# Patient Record
Sex: Male | Born: 2000 | Race: Asian | Hispanic: No | Marital: Single | Smoking: Never smoker
Health system: Southern US, Community
[De-identification: ages and names within clinical notes are randomized; demographics above are authoritative.]

---

## 2020-09-16 ENCOUNTER — Emergency Department (HOSPITAL_COMMUNITY): Payer: Medicaid Other

## 2020-09-16 ENCOUNTER — Other Ambulatory Visit: Payer: Self-pay

## 2020-09-16 ENCOUNTER — Emergency Department (HOSPITAL_COMMUNITY)
Admission: EM | Admit: 2020-09-16 | Discharge: 2020-09-17 | Disposition: A | Discharge status: Home / Self Care | Payer: Medicaid Other | Attending: Emergency Medicine | Admitting: Emergency Medicine

## 2020-09-16 ENCOUNTER — Encounter (HOSPITAL_COMMUNITY): Payer: Self-pay | Admitting: Emergency Medicine

## 2020-09-16 DIAGNOSIS — R0789 Other chest pain: Secondary | ICD-10-CM | POA: Insufficient documentation

## 2020-09-16 DIAGNOSIS — S42034A Nondisplaced fracture of lateral end of right clavicle, initial encounter for closed fracture: Secondary | ICD-10-CM | POA: Diagnosis not present

## 2020-09-16 DIAGNOSIS — Y92410 Unspecified street and highway as the place of occurrence of the external cause: Secondary | ICD-10-CM | POA: Diagnosis not present

## 2020-09-16 DIAGNOSIS — S4991XA Unspecified injury of right shoulder and upper arm, initial encounter: Secondary | ICD-10-CM | POA: Diagnosis present

## 2020-09-16 DIAGNOSIS — S82001A Unspecified fracture of right patella, initial encounter for closed fracture: Secondary | ICD-10-CM | POA: Insufficient documentation

## 2020-09-16 DIAGNOSIS — Z23 Encounter for immunization: Secondary | ICD-10-CM | POA: Insufficient documentation

## 2020-09-16 LAB — CBC WITH DIFFERENTIAL/PLATELET
Abs Immature Granulocytes: 0.07 10*3/uL (ref 0.00–0.07)
Basophils Absolute: 0.1 10*3/uL (ref 0.0–0.1)
Basophils Relative: 0 %
Eosinophils Absolute: 0 10*3/uL (ref 0.0–0.5)
Eosinophils Relative: 0 %
HCT: 43.7 % (ref 39.0–52.0)
Hemoglobin: 15 g/dL (ref 13.0–17.0)
Immature Granulocytes: 1 %
Lymphocytes Relative: 5 %
Lymphs Abs: 0.7 10*3/uL (ref 0.7–4.0)
MCH: 29.9 pg (ref 26.0–34.0)
MCHC: 34.3 g/dL (ref 30.0–36.0)
MCV: 87.1 fL (ref 80.0–100.0)
Monocytes Absolute: 1.2 10*3/uL — ABNORMAL HIGH (ref 0.1–1.0)
Monocytes Relative: 8 %
Neutro Abs: 13.1 10*3/uL — ABNORMAL HIGH (ref 1.7–7.7)
Neutrophils Relative %: 86 %
Platelets: 234 10*3/uL (ref 150–400)
RBC: 5.02 MIL/uL (ref 4.22–5.81)
RDW: 12 % (ref 11.5–15.5)
WBC: 15.2 10*3/uL — ABNORMAL HIGH (ref 4.0–10.5)
nRBC: 0 % (ref 0.0–0.2)

## 2020-09-16 LAB — COMPREHENSIVE METABOLIC PANEL
ALT: 111 U/L — ABNORMAL HIGH (ref 0–44)
AST: 147 U/L — ABNORMAL HIGH (ref 15–41)
Albumin: 4.1 g/dL (ref 3.5–5.0)
Alkaline Phosphatase: 46 U/L (ref 38–126)
Anion gap: 8 (ref 5–15)
BUN: 12 mg/dL (ref 6–20)
CO2: 28 mmol/L (ref 22–32)
Calcium: 9.1 mg/dL (ref 8.9–10.3)
Chloride: 104 mmol/L (ref 98–111)
Creatinine, Ser: 0.95 mg/dL (ref 0.61–1.24)
GFR, Estimated: >60 mL/min (ref >60)
Glucose, Bld: 130 mg/dL — ABNORMAL HIGH (ref 70–99)
Potassium: 3.5 mmol/L (ref 3.5–5.1)
Sodium: 140 mmol/L (ref 135–145)
Total Bilirubin: 0.6 mg/dL (ref 0.3–1.2)
Total Protein: 7.2 g/dL (ref 6.5–8.1)

## 2020-09-16 MED ORDER — TETANUS-DIPHTH-ACELL PERTUSSIS 5-2.5-18.5 LF-MCG/0.5 IM SUSY
0.5000 mL | PREFILLED_SYRINGE | Freq: Once | INTRAMUSCULAR | Status: AC
  Administered 2020-09-17: 0.5 mL via INTRAMUSCULAR
  Filled 2020-09-16: qty 0.5
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
## 2020-09-17 MED ORDER — OXYCODONE-ACETAMINOPHEN 5-325 MG PO TABS
1.0000 | ORAL_TABLET | Freq: Once | ORAL | Status: AC
  Administered 2020-09-17: 1 via ORAL
  Filled 2020-09-17: qty 1

## 2020-09-17 MED ORDER — OXYCODONE HCL 5 MG PO TABS: 5.0000 mg | ORAL_TABLET | ORAL | 0 refills | Status: AC | PRN

## 2020-09-17 MED ORDER — WHEELCHAIR MISC: 0 refills | Status: AC
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
## 2020-09-17 NOTE — Progress Notes (Signed)
Orthopedic Tech Progress Note Patient Details:  Stephen Kent 01-06-2001 [id number] Applied arm sling and knee immobilizer. Ortho Devices Type of Ortho Device: Knee Immobilizer,Sling immobilizer Ortho Device/Splint Location: RUE, RLE Ortho Device/Splint Interventions: Ordered,Application   Post Interventions Patient Tolerated: Well Instructions Provided: Adjustment of device   Kerry [place] 09/17/2020, 3:42 AM

## 2020-09-23 ENCOUNTER — Encounter: Payer: Self-pay | Admitting: Physician Assistant

## 2020-09-23 ENCOUNTER — Ambulatory Visit (INDEPENDENT_AMBULATORY_CARE_PROVIDER_SITE_OTHER): Payer: Medicaid Other | Admitting: Physician Assistant

## 2020-09-23 ENCOUNTER — Ambulatory Visit: Payer: Self-pay

## 2020-09-23 DIAGNOSIS — S82001A Unspecified fracture of right patella, initial encounter for closed fracture: Secondary | ICD-10-CM | POA: Diagnosis not present

## 2020-09-23 DIAGNOSIS — S42001A Fracture of unspecified part of right clavicle, initial encounter for closed fracture: Secondary | ICD-10-CM

## 2020-09-23 DIAGNOSIS — M25561 Pain in right knee: Secondary | ICD-10-CM
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
## 2020-10-09 ENCOUNTER — Encounter: Payer: Self-pay | Admitting: Orthopaedic Surgery

## 2020-10-09 ENCOUNTER — Ambulatory Visit (INDEPENDENT_AMBULATORY_CARE_PROVIDER_SITE_OTHER): Payer: Medicaid Other

## 2020-10-09 ENCOUNTER — Ambulatory Visit (INDEPENDENT_AMBULATORY_CARE_PROVIDER_SITE_OTHER): Payer: Medicaid Other | Admitting: Orthopaedic Surgery

## 2020-10-09 DIAGNOSIS — S82001A Unspecified fracture of right patella, initial encounter for closed fracture: Secondary | ICD-10-CM

## 2020-10-09 DIAGNOSIS — M25511 Pain in right shoulder: Secondary | ICD-10-CM

## 2020-10-09 DIAGNOSIS — S82001D Unspecified fracture of right patella, subsequent encounter for closed fracture with routine healing: Secondary | ICD-10-CM | POA: Diagnosis not present
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
## 2021-04-03 ENCOUNTER — Encounter (HOSPITAL_COMMUNITY): Payer: Self-pay | Admitting: Emergency Medicine

## 2021-04-03 ENCOUNTER — Ambulatory Visit (HOSPITAL_COMMUNITY)
Admission: EM | Admit: 2021-04-03 | Discharge: 2021-04-03 | Disposition: A | Discharge status: Home / Self Care | Payer: Medicaid Other | Attending: Emergency Medicine | Admitting: Emergency Medicine

## 2021-04-03 ENCOUNTER — Other Ambulatory Visit: Payer: Self-pay

## 2021-04-03 DIAGNOSIS — F1290 Cannabis use, unspecified, uncomplicated: Secondary | ICD-10-CM

## 2021-04-03 DIAGNOSIS — R1084 Generalized abdominal pain: Secondary | ICD-10-CM

## 2021-04-03 LAB — POCT URINALYSIS DIPSTICK, ED / UC
Bilirubin Urine: NEGATIVE
Glucose, UA: NEGATIVE mg/dL
Hgb urine dipstick: NEGATIVE
Ketones, ur: NEGATIVE mg/dL
Leukocytes,Ua: NEGATIVE
Nitrite: NEGATIVE
Protein, ur: NEGATIVE mg/dL
Specific Gravity, Urine: 1.02 (ref 1.005–1.030)
Urobilinogen, UA: 0.2 mg/dL (ref 0.0–1.0)
pH: 6.5 (ref 5.0–8.0)

## 2021-04-03 NOTE — ED Provider Notes (Signed)
[facility]    CSN: [id number] Arrival date & time: 04/03/21  1744      History   Chief Complaint Chief Complaint  Patient presents with   Abdominal Pain    HPI Stephen Kent is a 20 y.o. male.   Patient here for evaluation of abnormal feeling in his abdomen.  Reports that it "feels like warm wet liquid inside of his abdomen."  He also reports that it "feels like things are moving around in there."  Reports symptoms have been ongoing for the past 2 days.  Denies any specific abdominal pain, nausea, or vomiting.  Denies any dysuria, urgency, or frequency.  Reports that he recently started taking marijuana edibles to increase his appetite to help him gain weight.  Reports that in the last week or 2 he increased his dose.  Denies any trauma, injury, or other precipitating event.  Denies any specific alleviating or aggravating factors.  Denies any fevers, chest pain, shortness of breath, numbness, tingling, weakness, or headaches.     The history is provided by the patient.  Abdominal Pain Associated symptoms: no diarrhea, no nausea and no vomiting    History reviewed. No pertinent past medical history.  There are no problems to display for this patient.   History reviewed. No pertinent surgical history.     Home Medications    Prior to Admission medications   Medication Sig Start Date End Date Taking? Authorizing Provider  Misc. Devices [facility]) MISC 1 chair for home use 09/17/20   Garlon Hatchet, PA-C  oxyCODONE (OXY IR/ROXICODONE) 5 MG immediate release tablet Take 1 tablet (5 mg total) by mouth every 4 (four) hours as needed for severe pain. 09/17/20   Garlon Hatchet, PA-C    Family History Family History  Problem Relation Age of Onset   Kidney Stones Mother     Social History Social History   Tobacco Use   Smoking status: Never   Smokeless tobacco: Never  Vaping Use   Vaping Use: Never used  Substance Use Topics   Alcohol use: Yes   Drug use:  Never    Comment: edible     Allergies   Patient has no known allergies.   Review of Systems Review of Systems  Gastrointestinal:  Positive for abdominal pain. Negative for diarrhea, nausea and vomiting.  All other systems reviewed and are negative.   Physical Exam Triage Vital Signs ED Triage Vitals  Enc Vitals Group     BP 04/03/21 1859 105/86     Pulse Rate 04/03/21 1859 97     Resp 04/03/21 1859 18     Temp 04/03/21 1859 98.9 F (37.2 C)     Temp Source 04/03/21 1859 Oral     SpO2 04/03/21 1859 98 %     Weight --      Height --      Head Circumference --      Peak Flow --      Pain Score 04/03/21 1855 0     Pain Loc --      Pain Edu? --      Excl. in GC? --    No data found.  Updated Vital Signs BP 105/86 (BP Location: Left Arm)   Pulse 97   Temp 98.9 F (37.2 C) (Oral)   Resp 18   SpO2 98%   Visual Acuity Right Eye Distance:   Left Eye Distance:   Bilateral Distance:    Right Eye Near:  Left Eye Near:    Bilateral Near:     Physical Exam Vitals and nursing note reviewed.  Constitutional:      General: He is not in acute distress.    Appearance: Normal appearance. He is not ill-appearing, toxic-appearing or diaphoretic.  HENT:     Head: Normocephalic and atraumatic.  Eyes:     Conjunctiva/sclera: Conjunctivae normal.  Cardiovascular:     Rate and Rhythm: Normal rate.     Pulses: Normal pulses.  Pulmonary:     Effort: Pulmonary effort is normal.  Abdominal:     General: Abdomen is flat.     Palpations: Abdomen is soft.     Tenderness: There is no abdominal tenderness. There is no right CVA tenderness or left CVA tenderness.     Hernia: No hernia is present.  Musculoskeletal:        General: Normal range of motion.     Cervical back: Normal range of motion.  Skin:    General: Skin is warm and dry.  Neurological:     General: No focal deficit present.     Mental Status: He is alert and oriented to person, place, and time.   Psychiatric:        Mood and Affect: Mood normal.     UC Treatments / Results  Labs (all labs ordered are listed, but only abnormal results are displayed) Labs Reviewed  POCT URINALYSIS DIPSTICK, ED / UC    EKG   Radiology No results found.  Procedures Procedures (including critical care time)  Medications Ordered in UC Medications - No data to display  Initial Impression / Assessment and Plan / UC Course  I have reviewed the triage vital signs and the nursing notes.  Pertinent labs & imaging results that were available during my care of the patient were reviewed by me and considered in my medical decision making (see chart for details).    Assessment negative for red flags or concerns.  Urinalysis with no signs of infection.  This is likely related to marijuana use.  Recommend stop using edibles.  Drink plenty of fluids and rest.  Follow-up as needed Final Clinical Impressions(s) / UC Diagnoses   Final diagnoses:  Generalized abdominal pain  Marijuana use     Discharge Instructions      Stop using the edibles. Make sure you are drinking plenty of water.  Return or go to the Emergency Department if symptoms worsen or do not improve in the next few days.      ED Prescriptions   None    PDMP not reviewed this encounter.   Ivette Loyal, NP 04/03/21 1942

## 2021-04-03 NOTE — ED Triage Notes (Addendum)
Reports abdominal pain, patient denies burning with urination.patient reports feeling "warm water..basically like piss, warm and wet" wrapping around body.  Patient has had this sensation for 2-3 days.  Last bowel movement was today and normal .  Patient does not feel the need to urinate and this concerns him.  Patient does not report a decrease in urine output or any pain with urination.  Patient recently started eating edibles "to make him eat"

## 2021-04-03 NOTE — Discharge Instructions (Signed)
Stop using the edibles. Make sure you are drinking plenty of water.  Return or go to the Emergency Department if symptoms worsen or do not improve in the next few days.
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
